# Patient Record
Sex: Male | Born: 1965 | Race: White | Hispanic: No | Marital: Single | Smoking: Current every day smoker
Health system: Southern US, Community
[De-identification: ages and names within clinical notes are randomized; demographics above are authoritative.]

## PROBLEM LIST (undated history)

## (undated) DIAGNOSIS — I2510 Atherosclerotic heart disease of native coronary artery without angina pectoris: Secondary | ICD-10-CM

## (undated) DIAGNOSIS — K219 Gastro-esophageal reflux disease without esophagitis: Secondary | ICD-10-CM

## (undated) DIAGNOSIS — E119 Type 2 diabetes mellitus without complications: Secondary | ICD-10-CM

## (undated) DIAGNOSIS — K439 Ventral hernia without obstruction or gangrene: Secondary | ICD-10-CM

## (undated) DIAGNOSIS — K253 Acute gastric ulcer without hemorrhage or perforation: Secondary | ICD-10-CM

## (undated) DIAGNOSIS — K829 Disease of gallbladder, unspecified: Secondary | ICD-10-CM

## (undated) HISTORY — PX: STENT PLACEMENT VASCULAR (ARMC HX): HXRAD1737

---

## 2015-03-30 ENCOUNTER — Emergency Department: Payer: Medicaid - Out of State

## 2015-03-30 ENCOUNTER — Observation Stay
Admission: EM | Admit: 2015-03-30 | Discharge: 2015-03-31 | Disposition: A | Discharge status: Home / Self Care | Payer: Medicaid - Out of State | Attending: Internal Medicine | Admitting: Internal Medicine

## 2015-03-30 ENCOUNTER — Encounter: Payer: Self-pay | Admitting: Emergency Medicine

## 2015-03-30 DIAGNOSIS — Z794 Long term (current) use of insulin: Secondary | ICD-10-CM | POA: Insufficient documentation

## 2015-03-30 DIAGNOSIS — R61 Generalized hyperhidrosis: Secondary | ICD-10-CM | POA: Insufficient documentation

## 2015-03-30 DIAGNOSIS — Z955 Presence of coronary angioplasty implant and graft: Secondary | ICD-10-CM | POA: Insufficient documentation

## 2015-03-30 DIAGNOSIS — Z7982 Long term (current) use of aspirin: Secondary | ICD-10-CM | POA: Insufficient documentation

## 2015-03-30 DIAGNOSIS — Z79899 Other long term (current) drug therapy: Secondary | ICD-10-CM | POA: Insufficient documentation

## 2015-03-30 DIAGNOSIS — E782 Mixed hyperlipidemia: Secondary | ICD-10-CM | POA: Insufficient documentation

## 2015-03-30 DIAGNOSIS — Z7902 Long term (current) use of antithrombotics/antiplatelets: Secondary | ICD-10-CM | POA: Diagnosis not present

## 2015-03-30 DIAGNOSIS — R42 Dizziness and giddiness: Secondary | ICD-10-CM | POA: Diagnosis present

## 2015-03-30 DIAGNOSIS — F419 Anxiety disorder, unspecified: Secondary | ICD-10-CM

## 2015-03-30 DIAGNOSIS — I10 Essential (primary) hypertension: Secondary | ICD-10-CM | POA: Diagnosis not present

## 2015-03-30 DIAGNOSIS — E119 Type 2 diabetes mellitus without complications: Secondary | ICD-10-CM | POA: Diagnosis not present

## 2015-03-30 DIAGNOSIS — I2089 Other forms of angina pectoris: Secondary | ICD-10-CM

## 2015-03-30 DIAGNOSIS — K219 Gastro-esophageal reflux disease without esophagitis: Secondary | ICD-10-CM | POA: Insufficient documentation

## 2015-03-30 DIAGNOSIS — Z8711 Personal history of peptic ulcer disease: Secondary | ICD-10-CM | POA: Diagnosis not present

## 2015-03-30 DIAGNOSIS — R110 Nausea: Secondary | ICD-10-CM | POA: Insufficient documentation

## 2015-03-30 DIAGNOSIS — F17210 Nicotine dependence, cigarettes, uncomplicated: Secondary | ICD-10-CM | POA: Diagnosis not present

## 2015-03-30 DIAGNOSIS — R079 Chest pain, unspecified: Principal | ICD-10-CM | POA: Insufficient documentation

## 2015-03-30 DIAGNOSIS — Z8249 Family history of ischemic heart disease and other diseases of the circulatory system: Secondary | ICD-10-CM | POA: Diagnosis not present

## 2015-03-30 DIAGNOSIS — I2510 Atherosclerotic heart disease of native coronary artery without angina pectoris: Secondary | ICD-10-CM | POA: Diagnosis present

## 2015-03-30 DIAGNOSIS — R531 Weakness: Secondary | ICD-10-CM | POA: Insufficient documentation

## 2015-03-30 DIAGNOSIS — I208 Other forms of angina pectoris: Secondary | ICD-10-CM

## 2015-03-30 HISTORY — DX: Atherosclerotic heart disease of native coronary artery without angina pectoris: I25.10

## 2015-03-30 HISTORY — DX: Acute gastric ulcer without hemorrhage or perforation: K25.3

## 2015-03-30 HISTORY — DX: Ventral hernia without obstruction or gangrene: K43.9

## 2015-03-30 HISTORY — DX: Gastro-esophageal reflux disease without esophagitis: K21.9

## 2015-03-30 HISTORY — DX: Type 2 diabetes mellitus without complications: E11.9

## 2015-03-30 HISTORY — DX: Disease of gallbladder, unspecified: K82.9

## 2015-03-30 LAB — URINALYSIS COMPLETE WITH MICROSCOPIC (ARMC ONLY)
BACTERIA UA: NONE SEEN
Bilirubin Urine: NEGATIVE
Glucose, UA: NEGATIVE mg/dL
HGB URINE DIPSTICK: NEGATIVE
Ketones, ur: NEGATIVE mg/dL
LEUKOCYTES UA: NEGATIVE
Nitrite: NEGATIVE
PROTEIN: 30 mg/dL — AB
SPECIFIC GRAVITY, URINE: 1.027 (ref 1.005–1.030)
SQUAMOUS EPITHELIAL / LPF: NONE SEEN
pH: 6 (ref 5.0–8.0)

## 2015-03-30 LAB — TROPONIN I

## 2015-03-30 LAB — BASIC METABOLIC PANEL
Anion gap: 7 (ref 5–15)
BUN: 21 mg/dL — ABNORMAL HIGH (ref 6–20)
CHLORIDE: 104 mmol/L (ref 101–111)
CO2: 29 mmol/L (ref 22–32)
Calcium: 9.3 mg/dL (ref 8.9–10.3)
Creatinine, Ser: 0.87 mg/dL (ref 0.61–1.24)
GFR calc non Af Amer: >60 mL/min (ref >60)
Glucose, Bld: 154 mg/dL — ABNORMAL HIGH (ref 65–99)
POTASSIUM: 3.6 mmol/L (ref 3.5–5.1)
SODIUM: 140 mmol/L (ref 135–145)

## 2015-03-30 LAB — CBC
HEMATOCRIT: 39.7 % — AB (ref 40.0–52.0)
Hemoglobin: 14 g/dL (ref 13.0–18.0)
MCH: 30.8 pg (ref 26.0–34.0)
MCHC: 35.2 g/dL (ref 32.0–36.0)
MCV: 87.4 fL (ref 80.0–100.0)
Platelets: 279 10*3/uL (ref 150–440)
RBC: 4.54 MIL/uL (ref 4.40–5.90)
RDW: 12.9 % (ref 11.5–14.5)
WBC: 9.7 10*3/uL (ref 3.8–10.6)

## 2015-03-30 LAB — GLUCOSE, CAPILLARY: GLUCOSE-CAPILLARY: 138 mg/dL — AB (ref 65–99)

## 2015-03-30 MED ORDER — ALPRAZOLAM 0.25 MG PO TABS: 0.2500 mg | ORAL_TABLET | Freq: Three times a day (TID) | ORAL | Status: DC | PRN

## 2015-03-30 MED ORDER — CLOPIDOGREL BISULFATE 75 MG PO TABS
75.0000 mg | ORAL_TABLET | Freq: Once | ORAL | Status: AC
  Administered 2015-03-30: 75 mg via ORAL
  Filled 2015-03-30: qty 1

## 2015-03-30 MED ORDER — ASPIRIN 81 MG PO CHEW
324.0000 mg | CHEWABLE_TABLET | Freq: Once | ORAL | Status: AC
  Administered 2015-03-30: 324 mg via ORAL
  Filled 2015-03-30: qty 4

## 2015-03-30 MED ORDER — SODIUM CHLORIDE 0.9 % IJ SOLN
3.0000 mL | Freq: Two times a day (BID) | INTRAMUSCULAR | Status: DC
  Administered 2015-03-30: 3 mL via INTRAVENOUS
  Administered 2015-03-31: 09:00:00 via INTRAVENOUS

## 2015-03-30 MED ORDER — CLOPIDOGREL BISULFATE 75 MG PO TABS
75.0000 mg | ORAL_TABLET | Freq: Every day | ORAL | Status: DC
Start: 2015-03-30 — End: 2015-03-31
  Administered 2015-03-31: 75 mg via ORAL
  Filled 2015-03-30: qty 1

## 2015-03-30 MED ORDER — HEPARIN SODIUM (PORCINE) 5000 UNIT/ML IJ SOLN: 5000.0000 [IU] | Freq: Three times a day (TID) | INTRAMUSCULAR | Status: DC

## 2015-03-30 MED ORDER — NITROGLYCERIN 0.4 MG SL SUBL: 0.4000 mg | SUBLINGUAL_TABLET | SUBLINGUAL | Status: DC | PRN

## 2015-03-30 MED ORDER — ASPIRIN EC 325 MG PO TBEC
325.0000 mg | DELAYED_RELEASE_TABLET | Freq: Every day | ORAL | Status: DC
Start: 2015-03-30 — End: 2015-03-31
  Administered 2015-03-31: 325 mg via ORAL
  Filled 2015-03-30: qty 1

## 2015-03-30 MED ORDER — INSULIN ASPART 100 UNIT/ML ~~LOC~~ SOLN
0.0000 [IU] | Freq: Three times a day (TID) | SUBCUTANEOUS | Status: DC
  Administered 2015-03-31: 1 [IU] via SUBCUTANEOUS
  Filled 2015-03-30: qty 1

## 2015-03-30 NOTE — H&P (Addendum)
[facility] - [place] at [facility]   PATIENT NAME: Travis Bradshaw    MR#:  [id number]  DATE OF BIRTH:  23-Aug-1965  DATE OF ADMISSION:  03/30/2015  PRIMARY CARE PHYSICIAN: No primary care provider on file.   REQUESTING/REFERRING PHYSICIAN: Quale  CHIEF COMPLAINT:   Chief Complaint  Patient presents with  . Weakness  . Dizziness    HISTORY OF PRESENT ILLNESS: Travis Bradshaw  is a 49 y.o. male with a known history of coronary artery disease and total 8 stent since 2002 to the last one in April 2016, takes his cardiac medications regularly and did not miss any doses. He is visiting from [place], and for last 2 days he has some stress related to his work- up and he started feeling anxious , dizzy, sweating, tingling in numbness in all body, nauseated- these episodes are coming frequently so finally came to emergency room for that. He clearly denies any chest pains or palpitations. But he feels that his blood pressure would be high. On further history he said that a month ago he had similar episodes running for 3-4 days, he went to his primary care doctor. And he was admitted to hospital for 2-3 days where all the workup were done, his cardiologist also saw him in the hospital and they did a nuclear stress test- which was reported to be negative. So finally he discharged him saying that his heart is okay and after liver discharge he was completely fine no complaints until 2 days ago when they started again.  Patient also says that in the past always his stress test and blood work for the hardware has been negative and one time it even happened after having negative stress test within 3 days he had massive MI and had to go for stent.  PAST MEDICAL HISTORY:   Past Medical History  Diagnosis Date  . CAD (coronary artery disease)   . GERD (gastroesophageal reflux disease)   . DM (diabetes mellitus) (HCC)   . Gallbladder attack   . Gastric ulcer, acute   .  Hernia of abdominal wall     PAST SURGICAL HISTORY:  Past Surgical History  Procedure Laterality Date  . Stent placement vascular ([facility] hx)      SOCIAL HISTORY:  Social History  Substance Use Topics  . Smoking status: Current Every Day Smoker -- 0.25 packs/day    Types: Cigarettes  . Smokeless tobacco: Current User    Types: Chew  . Alcohol Use: No    FAMILY HISTORY:  Family History  Problem Relation Age of Onset  . CAD Brother     DRUG ALLERGIES: No Known Allergies  REVIEW OF SYSTEMS:   CONSTITUTIONAL: No fever, fatigue or weakness. Have dizziness EYES: No blurred or double vision.  EARS, NOSE, AND THROAT: No tinnitus or ear pain.  RESPIRATORY: No cough, shortness of breath, wheezing or hemoptysis.  CARDIOVASCULAR: No chest pain, orthopnea, edema.  GASTROINTESTINAL: No nausea, vomiting, diarrhea or abdominal pain.  GENITOURINARY: No dysuria, hematuria.  ENDOCRINE: No polyuria, nocturia,  HEMATOLOGY: No anemia, easy bruising or bleeding SKIN: No rash or lesion. MUSCULOSKELETAL: No joint pain or arthritis.   NEUROLOGIC: Positive for generalized tingling, numbness, no weakness.  PSYCHIATRY: No anxiety or depression.   MEDICATIONS AT HOME:   aspirin EC 81 MG tablet Take 81 mg by mouth at bedtime. Historical Provider, MD Needs Review carvedilol (COREG) 12.5 MG tablet Take 12.5 mg by mouth at bedtime. Historical Provider, MD Needs Review  clopidogrel (PLAVIX) 75 MG tablet Take 75 mg by mouth daily. Historical Provider, MD Needs Review gemfibrozil (LOPID) 600 MG tablet Take 600 mg by mouth at bedtime. Historical Provider, MD Needs Review   PHYSICAL EXAMINATION:   VITAL SIGNS: Blood pressure 142/87, pulse 92, temperature 98.4 F (36.9 C), temperature source Oral, resp. rate 18, height 5' 3.5" (1.613 m), weight 74.39 kg (164 lb), SpO2 98 %.  GENERAL:  49 y.o.-year-old patient lying in the bed with no acute distress.  EYES: Pupils equal, round, reactive to light and  accommodation. No scleral icterus. Extraocular muscles intact.  HEENT: Head atraumatic, normocephalic. Oropharynx and nasopharynx clear.  NECK:  Supple, no jugular venous distention. No thyroid enlargement, no tenderness.  LUNGS: Normal breath sounds bilaterally, no wheezing, rales,rhonchi or crepitation. No use of accessory muscles of respiration.  CARDIOVASCULAR: S1, S2 normal. No murmurs, rubs, or gallops.  ABDOMEN: Soft, nontender, nondistended. Bowel sounds present. No organomegaly or mass.  EXTREMITIES: No pedal edema, cyanosis, or clubbing.  NEUROLOGIC: Cranial nerves II through XII are intact. Muscle strength 5/5 in all extremities. Sensation intact. Gait not checked.  PSYCHIATRIC: The patient is alert and oriented x 3.  SKIN: No obvious rash, lesion, or ulcer.   LABORATORY PANEL:   CBC  Recent Labs Lab 03/30/15 1808  WBC 9.7  HGB 14.0  HCT 39.7*  PLT 279  MCV 87.4  MCH 30.8  MCHC 35.2  RDW 12.9   ------------------------------------------------------------------------------------------------------------------  Chemistries   Recent Labs Lab 03/30/15 1808  NA 140  K 3.6  CL 104  CO2 29  GLUCOSE 154*  BUN 21*  CREATININE 0.87  CALCIUM 9.3   ------------------------------------------------------------------------------------------------------------------ estimated creatinine clearance is 96.9 mL/min (by C-G formula based on Cr of 0.87). ------------------------------------------------------------------------------------------------------------------ No results for input(s): TSH, T4TOTAL, T3FREE, THYROIDAB in the last 72 hours.  Invalid input(s): FREET3   Coagulation profile No results for input(s): INR, PROTIME in the last 168 hours. ------------------------------------------------------------------------------------------------------------------- No results for input(s): DDIMER in the last 72  hours. -------------------------------------------------------------------------------------------------------------------  Cardiac Enzymes  Recent Labs Lab 03/30/15 1808  TROPONINI <0.03   ------------------------------------------------------------------------------------------------------------------ Invalid input(s): POCBNP  ---------------------------------------------------------------------------------------------------------------  Urinalysis No results found for: COLORURINE, APPEARANCEUR, LABSPEC, PHURINE, GLUCOSEU, HGBUR, BILIRUBINUR, KETONESUR, PROTEINUR, UROBILINOGEN, NITRITE, LEUKOCYTESUR   RADIOLOGY: Dg Chest 2 View  03/30/2015  CLINICAL DATA:  Dizziness, lightheadedness, and weakness for 2 days. Coronary artery disease. EXAM: CHEST  2 VIEW COMPARISON:  None. FINDINGS: The heart size and mediastinal contours are within normal limits. Both lungs are clear. No evidence of pneumothorax or pleural effusion. Coronary artery stent noted. The visualized skeletal structures are unremarkable. IMPRESSION: No active cardiopulmonary disease. Electronically Signed   By: Myles RosenthalJohn  Stahl M.D.   On: 03/30/2015 19:41    EKG: Slight T wave inversion- in inferolateral leads.  IMPRESSION AND PLAN:  * Vague complaints including dizziness, numbness, sweating, nausea, nausea.   This may be either anxiety episode or equivalent to angina and unusual presentation.  As he says in the past also he had similar kind of presentation but with chest pain for his MI.   Monitor on telemetry, follow serial troponin, get cardiology evaluation tomorrow morning for further planning.  Meanwhile continue his cardiac medications.  * Anxiety episode  We will give Xanax 0.25 mg every 8 hours as needed.  * Diabetes  We'll keep him on insulin sliding scale coverage.  * Smoking  Counseled to quit smoking for 4 minutes and offered nicotine patch.  All the records are reviewed and case discussed  with ED  provider. Management plans discussed with the patient, family and they are in agreement.  CODE STATUS: Full code   TOTAL TIME TAKING CARE OF THIS PATIENT: 50 minutes.    Altamese Dilling M.D on 03/30/2015   Between 7am to 6pm - Pager - [phone]  After 6pm go to [email or web address] - password [email or web address] [facility]  [facility]  Office  [phone]  CC: Primary care physician; No primary care provider on file.   Note: This dictation was prepared with Dragon dictation along with smaller phrase technology. Any transcriptional errors that result from this process are unintentional.

## 2015-03-30 NOTE — ED Notes (Signed)
Pt C/O light-headedness, weakness, dizziness, and hot flashes. Pt states cardiac hx including 8 stents at this time. Pt denies chest pain at this time. Pt also has hx of hypokalemia and was told that his symptoms could send him into cardiac arrest.

## 2015-03-30 NOTE — ED Provider Notes (Signed)
[facility] Emergency Department Provider Note REMINDER - THIS NOTE IS NOT A FINAL MEDICAL RECORD UNTIL IT IS SIGNED. UNTIL THEN, THE CONTENT BELOW MAY REFLECT INFORMATION FROM A DOCUMENTATION TEMPLATE, NOT THE ACTUAL PATIENT VISIT. ____________________________________________  Time seen: Approximately 7:11 PM  I have reviewed the triage vital signs and the nursing notes.   HISTORY  Chief Complaint Weakness and Dizziness    HPI Travis Bradshaw is a 49 y.o. male presents with intermittent nausea, lightheadedness, sweatiness, which is been fairly steady but will come and go worse off and on for the last 2 days. Reports this feels just like previous heart attack except he's not having any associated chest pain. He is a diabetic. He has not taken his aspirin or Plavix at today.  Continues to smoke. Last catheterization was in April where he said he had a occluded vessel. He is from [place] and traveling.  Past Medical History  Diagnosis Date  . CAD (coronary artery disease)   . GERD (gastroesophageal reflux disease)   . DM (diabetes mellitus) (HCC)   . Gallbladder attack   . Gastric ulcer, acute   . Hernia of abdominal wall     Patient Active Problem List   Diagnosis Date Noted  . Angina at rest [facility](HCC) 03/30/2015  . Anxiety 03/30/2015    Past Surgical History  Procedure Laterality Date  . Stent placement vascular ([facility] hx)      No current outpatient prescriptions on file.  Allergies Review of patient's allergies indicates no known allergies.  Family History  Problem Relation Age of Onset  . CAD Brother     Social History Social History  Substance Use Topics  . Smoking status: Current Every Day Smoker -- 0.25 packs/day    Types: Cigarettes  . Smokeless tobacco: Current User    Types: Chew  . Alcohol Use: No    Review of Systems Constitutional: No fever/chills Eyes: No visual changes. ENT: No sore throat. Cardiovascular: Denies chest  pain. Respiratory: Denies shortness of breath. Gastrointestinal: No abdominal pain.  Nausea and feels as though he could vomit but has not. Took a nitroglycerin with no relief.  No diarrhea.  No constipation. Genitourinary: Negative for dysuria. Musculoskeletal: Negative for back pain. Skin: Negative for rash. Neurological: Negative for headaches, focal weakness or numbness.  Feels generally fatigued and weak off-and-on. No focal weakness, no slurred speech. No numbness or tingling in one or extremity, but at times he feels like a whole body tingling feeling.  10-point ROS otherwise negative.  ____________________________________________   PHYSICAL EXAM:  VITAL SIGNS: ED Triage Vitals  Enc Vitals Group     BP 03/30/15 1804 142/87 mmHg     Pulse Rate 03/30/15 1804 92     Resp 03/30/15 1804 18     Temp 03/30/15 1804 98.4 F (36.9 C)     Temp Source 03/30/15 1804 Oral     SpO2 03/30/15 1804 98 %     Weight 03/30/15 1804 164 lb (74.39 kg)     Height 03/30/15 1804 5' 3.5" (1.613 m)     Head Cir --      Peak Flow --      Pain Score --      Pain Loc --      Pain Edu? --      Excl. in GC? --    Constitutional: Alert and oriented. Well appearing and in no acute distress. Eyes: Conjunctivae are normal. PERRL. EOMI. Head: Atraumatic. Nose: No congestion/rhinnorhea. Mouth/Throat:  Mucous membranes are moist.  Oropharynx non-erythematous. Neck: No stridor.   Cardiovascular: Normal rate, regular rhythm. Grossly normal heart sounds.  Good peripheral circulation. Respiratory: Normal respiratory effort.  No retractions. Lungs CTAB. Gastrointestinal: Soft and nontender. No distention. No abdominal bruits. No CVA tenderness. Musculoskeletal: No lower extremity tenderness nor edema.  No joint effusions. Neurologic:  Normal speech and language. No gross focal neurologic deficits are appreciated. No gait instability. Skin:  Skin is warm, dry and intact. No rash noted. Psychiatric: Mood and  affect are normal. Speech and behavior are normal.  ____________________________________________   LABS (all labs ordered are listed, but only abnormal results are displayed)  Labs Reviewed  BASIC METABOLIC PANEL - Abnormal; Notable for the following:    Glucose, Bld 154 (*)    BUN 21 (*)    All other components within normal limits  CBC - Abnormal; Notable for the following:    HCT 39.7 (*)    All other components within normal limits  TROPONIN I  URINALYSIS COMPLETEWITH MICROSCOPIC ([facility] ONLY)  LIPID PANEL  TROPONIN I  TROPONIN I  TROPONIN I  CBG MONITORING, ED   ____________________________________________  EKG  Reviewed and interpreted by me EKG done at 1802 Heart rate 85 PR 160 QRS 80 QTc 406 Normal sinus rhythm, evidence of old anterior MI, there is some very slight less than 1 mm of ST elevation versus artifact into 3 and aVF, also minimal in the left lateral leads. Does not meet STEMI criteria, could be early repolarization. 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 Does not meet STEMI criteria, could be early repolarization. Compared with previous EKG from 1 hour earlier no significant changes found ____________________________________________  RADIOLOGY  FINDINGS: The heart size and mediastinal contours are within normal limits. Both lungs are clear. No evidence of pneumothorax or pleural effusion. Coronary artery stent noted. The visualized skeletal structures are unremarkable.  IMPRESSION: No active cardiopulmonary disease. ____________________________________________   PROCEDURES  Procedure(s) performed: None  Critical Care performed: No  ____________________________________________   INITIAL  IMPRESSION / ASSESSMENT AND PLAN / ED COURSE  Pertinent labs & imaging results that were available during my care of the patient were reviewed by me and considered in my medical decision making (see chart for details).  Patient is family reports to me that he is had 8 cardiac stents, presenting with lightheadedness, weakness dizziness and diaphoresis but generally comfortable chest pain. He reports to me that he has all of his symptoms of cardiac disease except for the chest pain component. He's had previous he states EKGs and been normal along with normal troponins, but has had occluded stents or heart attacks despite normal EKGs and negative troponins.  Given the patient's symptomatology, though he does not have chest pain he does have concerning symptoms could nausea, diaphoresis, fatigue, and reports these may be his anginal: Suspicion in a patient who is a known diabetic who may have a silent MI. Is a very strong history of cardiac disease. He has 2 siblings in her 51s also died of MI. The present time he has some questionable minimal T-wave abnormality which may be normal for him seen primarily in the inferior distribution, and stable across 2 EKGs without  evidence of meeting STEMI criteria. Discussed with the patient and based on his strong history, and concerning presentation and symptomatology will admit him for chest pain observation exclusion of coronary disease.      Pulmonary Embolism Rule-out Criteria (PERC rule)                        If YES to ANY of the following, the PERC rule is not satisfied and cannot be used to rule out PE in this patient (consider d-dimer or imaging depending on pre-test probability).                      If NO to ALL of the following, AND the clinician's pre-test probability is <15%, the [facility] rule is satisfied and there is no need for further workup (including no need to obtain a d-dimer) as the post-test probability of pulmonary embolism is <2%.                       Mnemonic is HAD CLOTS   H - hormone use (exogenous estrogen)      No. A - age > 50                                                 No. D - DVT/PE history                                      No.   C - coughing blood (hemoptysis)                 No. L - leg swelling, unilateral                             No. O - O2 Sat on Room Air < 95%                  No. T - tachycardia (HR ? 100)                         No. S - surgery or trauma, recent                      No.   Based on my evaluation of the patient, including application of this decision instrument, further testing to evaluate for pulmonary embolism is not indicated at this time. I have discussed this recommendation with the patient who states understanding and agreement with this plan.  ____________________________________________   FINAL CLINICAL IMPRESSION(S) / ED DIAGNOSES  Final diagnoses:  Nausea  Cardiovascular disease  Lightheaded      Sharyn Creamer, MD 03/30/15 2010

## 2015-03-31 LAB — CBC
HCT: 39 % — ABNORMAL LOW (ref 40.0–52.0)
HEMOGLOBIN: 13.4 g/dL (ref 13.0–18.0)
MCH: 30.3 pg (ref 26.0–34.0)
MCHC: 34.4 g/dL (ref 32.0–36.0)
MCV: 87.9 fL (ref 80.0–100.0)
PLATELETS: 240 10*3/uL (ref 150–440)
RBC: 4.43 MIL/uL (ref 4.40–5.90)
RDW: 12.7 % (ref 11.5–14.5)
WBC: 8.1 10*3/uL (ref 3.8–10.6)

## 2015-03-31 LAB — BASIC METABOLIC PANEL
ANION GAP: 8 (ref 5–15)
BUN: 19 mg/dL (ref 6–20)
CALCIUM: 8.9 mg/dL (ref 8.9–10.3)
CO2: 26 mmol/L (ref 22–32)
CREATININE: 0.68 mg/dL (ref 0.61–1.24)
Chloride: 105 mmol/L (ref 101–111)
GLUCOSE: 138 mg/dL — AB (ref 65–99)
Potassium: 3.9 mmol/L (ref 3.5–5.1)
Sodium: 139 mmol/L (ref 135–145)

## 2015-03-31 LAB — GLUCOSE, CAPILLARY
GLUCOSE-CAPILLARY: 144 mg/dL — AB (ref 65–99)
Glucose-Capillary: 120 mg/dL — ABNORMAL HIGH (ref 65–99)

## 2015-03-31 LAB — LIPID PANEL
CHOLESTEROL: 234 mg/dL — AB (ref 0–200)
HDL: 29 mg/dL — ABNORMAL LOW (ref >40)
LDL Cholesterol: 173 mg/dL — ABNORMAL HIGH (ref 0–99)
Total CHOL/HDL Ratio: 8.1 RATIO
Triglycerides: 162 mg/dL — ABNORMAL HIGH (ref <150)
VLDL: 32 mg/dL (ref 0–40)

## 2015-03-31 LAB — TROPONIN I

## 2015-03-31 MED ORDER — ISOSORBIDE MONONITRATE ER 30 MG PO TB24: 30.0000 mg | ORAL_TABLET | Freq: Every day | ORAL | Status: AC

## 2015-03-31 MED ORDER — ISOSORBIDE MONONITRATE ER 30 MG PO TB24
30.0000 mg | ORAL_TABLET | Freq: Every day | ORAL | Status: DC
  Administered 2015-03-31: 30 mg via ORAL
  Filled 2015-03-31: qty 1

## 2015-03-31 NOTE — Discharge Summary (Signed)
Travis Bradshaw, is a 49 y.o. male  DOB May 12, 1965  MRN [id number].  Admission date:  03/30/2015  Admitting Physician  Vaughan Basta, MD  Discharge Date:  03/31/2015   Primary MD  No primary care provider on file.  Recommendations for primary care physician for things to follow:   Follow-up with primary cardiologist in [place] in 1 week.   Admission Diagnosis  Cardiovascular disease [I25.10] Nausea [R11.0] Lightheaded [R42] Anginal equivalent ([place]) [I20.8]   Discharge Diagnosis  Cardiovascular disease [I25.10] Nausea [R11.0] Lightheaded [R42] Anginal equivalent ([place]) [I20.8]   Principal Problem:   Angina at rest [facility]) Active Problems:   Anxiety      Past Medical History  Diagnosis Date  . CAD (coronary artery disease)   . GERD (gastroesophageal reflux disease)   . DM (diabetes mellitus) ([place])   . Gallbladder attack   . Gastric ulcer, acute   . Hernia of abdominal wall     Past Surgical History  Procedure Laterality Date  . Stent placement vascular ([facility] hx)         History of present illness and  Hospital Course:     Kindly see H&P for history of present illness and admission details, please review complete Labs, Consult reports and Test reports for all details in brief  HPI  from the history and physical done on the day of admission  49 year old male patient with history of for coronary artery disease and multiple stents came in because of fatigue, nausea or diaphoresis and chest pain. Patient is visiting the his family from [place] for Thanksgiving. Admitted to hospitalist service on telemetry for chest pain. Patient EKG did not show any acute ST-T changes.  Hospital Course  1. chest pain likely due to anxiety: Troponins are negative for 2 times. Patient CBC and met B are normal.  Started on Xanax for anxiety. Today he feels less nauseous no chest pain and eager to go home. Patient primary cardiologist is in [place] and advised him to follow up with him.  #2 coronary artery disease with multiple stents placed:; Not MI at this time. Continue aspirin, Plavix, beta blockers. Started on low dose of Imdur.. Patient LDL is 173. 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 And he says he cannot take them anymore. He takes gemfibrozil. Advised him to continue that and also continue to take omega-3 fatty acids up to 4 g per day. He is already bought them and advised him to continue that.   #3 anxiety issues patient is asking about medication for anxiety advised him to follow-up with his primary doctor for follow-up to see if he can be started on Paxil.  Discharge Condition stable   Follow UP      Discharge Instructions  and  Discharge Medications        Medication List    TAKE these medications        aspirin EC 81 MG tablet  Take 81 mg by mouth at bedtime.     carvedilol 12.5 MG tablet  Commonly known as:  COREG  Take 12.5 mg by mouth at bedtime.     clopidogrel 75 MG tablet  Commonly known as:  PLAVIX  Take 75 mg by mouth daily.     gemfibrozil 600 MG tablet  Commonly known as:  LOPID  Take 600 mg by mouth at bedtime.     isosorbide mononitrate 30 MG 24 hr tablet  Commonly known as:  IMDUR  Take 1 tablet (  30 mg total) by mouth daily.          Diet and Activity recommendation: See Discharge Instructions above   Consults obtained - cardiology   Major procedures and Radiology Reports - PLEASE review detailed and final reports for all details, in brief -      Dg Chest 2 View  03/30/2015  CLINICAL DATA:  Dizziness, lightheadedness, and weakness for 2 days. Coronary artery disease. EXAM: CHEST  2 VIEW COMPARISON:  None. FINDINGS: The heart size and mediastinal contours are within normal limits. Both lungs are clear.  No evidence of pneumothorax or pleural effusion. Coronary artery stent noted. The visualized skeletal structures are unremarkable. IMPRESSION: No active cardiopulmonary disease. Electronically Signed   By: Earle Gell M.D.   On: 03/30/2015 19:41    Micro Results     No results found for this or any previous visit (from the past 240 hour(s)).     Today   Subjective:   Travis Bradshaw today has no headache,no chest abdominal pain,no new weakness tingling or numbness, feels much better wants to go home today.  Objective:   Blood pressure 115/76, pulse 74, temperature 97.8 F (36.6 C), temperature source Oral, resp. rate 18, height 5' 3.5" (1.613 m), weight 74.39 kg (164 lb), SpO2 98 %.   Intake/Output Summary (Last 24 hours) at 03/31/15 1259 Last data filed at 03/31/15 0900  Gross per 24 hour  Intake    245 ml  Output      0 ml  Net    245 ml    Exam Awake Alert, Oriented x 3, No new F.N deficits, Normal affect [place].AT,PERRAL Supple Neck,No JVD, No cervical lymphadenopathy appriciated.  Symmetrical Chest wall movement, Good air movement bilaterally, CTAB RRR,No Gallops,Rubs or new Murmurs, No Parasternal Heave +ve B.Sounds, Abd Soft, Non tender, No organomegaly appriciated, No rebound -guarding or rigidity. No Cyanosis, Clubbing or edema, No new Rash or bruise  Data Review   CBC w Diff: Lab Results  Component Value Date   WBC 8.1 03/31/2015   HGB 13.4 03/31/2015   HCT 39.0* 03/31/2015   PLT 240 03/31/2015    CMP: Lab Results  Component Value Date   NA 139 03/31/2015   K 3.9 03/31/2015   CL 105 03/31/2015   CO2 26 03/31/2015   BUN 19 03/31/2015   CREATININE 0.68 03/31/2015  .   Total Time in preparing paper work, data evaluation and todays exam - 25 minutes  Mazen Marcin M.D on 03/31/2015 at 12:59 PM    Note: This dictation was prepared with Dragon dictation along with smaller phrase technology. Any transcriptional errors that result from this  process are unintentional.

## 2015-03-31 NOTE — Progress Notes (Signed)
Arrival Method: via stretcher with ED techs Mental Orientation: A&O Telemetry: [id number] Skin: Intact, verified with Teodoro SprayBrooke Robertson, RN IV: 20g left forearm Pain:  No pain Safety Measures: Safety Fall Prevention Plan has been given, discussed & signed, non skid socks in place. howerver patient does not want to wear the socks, educated to call for assistance. 2A Orientation: Patient has been orientated to the room, unit & staff.  Family: Has been informed of plan of care.  Orders have been reviewed & implemented. Will continue to monitor the patient. Call light has been placed within reach.  Eden LatheLexi Miller, RN

## 2015-03-31 NOTE — Discharge Instructions (Signed)
Isosorbide Mononitrate extended-release tablets  What is this medicine?  ISOSORBIDE MONONITRATE (eye soe SOR bide mon oh NYE trate) is a vasodilator. It relaxes blood vessels, increasing the blood and oxygen supply to your heart. This medicine is used to prevent chest pain caused by angina. It will not help to stop an episode of chest pain.  This medicine may be used for other purposes; ask your health care provider or pharmacist if you have questions.  What should I tell my health care provider before I take this medicine?  They need to know if you have any of these conditions:  -previous heart attack or heart failure  -an unusual or allergic reaction to isosorbide mononitrate, nitrates, other medicines, foods, dyes, or preservatives  -pregnant or trying to get pregnant  -breast-feeding  How should I use this medicine?  Take this medicine by mouth with a glass of water. Follow the directions on the prescription label. Do not crush or chew. Take your medicine at regular intervals. Do not take your medicine more often than directed. Do not stop taking this medicine except on the advice of your doctor or health care professional.  Talk to your pediatrician regarding the use of this medicine in children. Special care may be needed.  Overdosage: If you think you have taken too much of this medicine contact a poison control center or emergency room at once.  NOTE: This medicine is only for you. Do not share this medicine with others.  What if I miss a dose?  If you miss a dose, take it as soon as you can. If it is almost time for your next dose, take only that dose. Do not take double or extra doses.  What may interact with this medicine?  Do not take this medicine with any of the following medications:  -medicines used to treat erectile dysfunction (ED) like avanafil, sildenafil, tadalafil, and vardenafil  -riociguat  This medicine may also interact with the following medications:  -medicines for high blood  pressure  -other medicines for angina or heart failure  This list may not describe all possible interactions. Give your health care provider a list of all the medicines, herbs, non-prescription drugs, or dietary supplements you use. Also tell them if you smoke, drink alcohol, or use illegal drugs. Some items may interact with your medicine.  What should I watch for while using this medicine?  Check your heart rate and blood pressure regularly while you are taking this medicine. Ask your doctor or health care professional what your heart rate and blood pressure should be and when you should contact him or her. Tell your doctor or health care professional if you feel your medicine is no longer working.  You may get dizzy. Do not drive, use machinery, or do anything that needs mental alertness until you know how this medicine affects you. To reduce the risk of dizzy or fainting spells, do not sit or stand up quickly, especially if you are an older patient. Alcohol can make you more dizzy, and increase flushing and rapid heartbeats. Avoid alcoholic drinks.  Do not treat yourself for coughs, colds, or pain while you are taking this medicine without asking your doctor or health care professional for advice. Some ingredients may increase your blood pressure.  What side effects may I notice from receiving this medicine?  Side effects that you should report to your doctor or health care professional as soon as possible:  -bluish discoloration of lips, fingernails, or palms of   hands  -irregular heartbeat, palpitations  -low blood pressure  -nausea, vomiting  -persistent headache  -unusually weak or tired  Side effects that usually do not require medical attention (report to your doctor or health care professional if they continue or are bothersome):  -flushing of the face or neck  -rash  This list may not describe all possible side effects. Call your doctor for medical advice about side effects. You may report side effects to  FDA at 1-800-FDA-1088.  Where should I keep my medicine?  Keep out of the reach of children.  Store between 15 and 30 degrees C (59 and 86 degrees F). Keep container tightly closed. Throw away any unused medicine after the expiration date.  NOTE: This sheet is a summary. It may not cover all possible information. If you have questions about this medicine, talk to your doctor, pharmacist, or health care provider.     © 2016, [employer]. (2013-02-19 14:48:19)

## 2015-03-31 NOTE — Progress Notes (Signed)
Patient discharged via wheelchair and private vehicle. IV removed. Discharge instructions given and patient verbalized understanding. Telemetry box removed and returned.

## 2015-03-31 NOTE — Consult Note (Signed)
[facility] Cardiology Consultation Note  Patient ID: Travis Bradshaw, MRN: [id number], DOB/AGE: 12-19-1965 49 y.o. Admit date: 03/30/2015   Date of Consult: 03/31/2015 Primary Physician: No primary care provider on file. Primary Cardiologist: None  Chief Complaint:  Chief Complaint  Patient presents with  . Weakness  . Dizziness   Reason for Consult: known coronary artery disease with unusual chest discomfort  HPI: 49 y.o. male with known coronary artery disease with multiple stents in the past who has recently had a stent in April 2016. At that time the patient had weakness fatigue chest pain nausea and diaphoresis. At that time he underwent PCI and stent placement and had some improvements. The patient has done fairly well until recently when he is had some minor symptoms of dizziness and unsteadiness as well as some mild amount of shortness of breath. He was seen in the hospital with a normal stress test normal echocardiogram and no evidence of myocardial infarction with no changes in EKG. Since he is traveled here to [facility] the patient has had some minor issues of the weakness and slightly dizziness of unknown etiology. There is concerns that this is from a cardiovascular standpoint and he has had observation overnight. With this observation overnight there is been no evidence of myocardial infarction and the patient has had a normal EKG and normal telemetry. There has been no progression of symptoms at this time. The patient previously has been on statin therapy although is not currently on statin therapy. He has been on aspirin and Plavix for recent stent placement. The patient has been discussed the possibility that there is no current myocardial infarction and may be able to be discharged home after ambulation and further evaluated at his primary cardiologist  Past Medical History  Diagnosis Date  . CAD (coronary artery disease)   . GERD (gastroesophageal reflux disease)   . DM  (diabetes mellitus) (HCC)   . Gallbladder attack   . Gastric ulcer, acute   . Hernia of abdominal wall       Surgical History:  Past Surgical History  Procedure Laterality Date  . Stent placement vascular ([facility] hx)       Home Meds: Prior to Admission medications   Medication Sig Start Date End Date Taking? Authorizing Provider  aspirin EC 81 MG tablet Take 81 mg by mouth at bedtime.   Yes Historical Provider, MD  carvedilol (COREG) 12.5 MG tablet Take 12.5 mg by mouth at bedtime.   Yes Historical Provider, MD  clopidogrel (PLAVIX) 75 MG tablet Take 75 mg by mouth daily.   Yes Historical Provider, MD  gemfibrozil (LOPID) 600 MG tablet Take 600 mg by mouth at bedtime.   Yes Historical Provider, MD    Inpatient Medications:  . aspirin EC  325 mg Oral Daily  . clopidogrel  75 mg Oral Daily  . heparin  5,000 Units Subcutaneous 3 times per day  . insulin aspart  0-9 Units Subcutaneous TID WC  . isosorbide mononitrate  30 mg Oral Daily  . sodium chloride  3 mL Intravenous Q12H      Allergies: No Known Allergies  Social History   Social History  . Marital Status: Single    Spouse Name: N/A  . Number of Children: N/A  . Years of Education: N/A   Occupational History  . Not on file.   Social History Main Topics  . Smoking status: Current Every Day Smoker -- 0.25 packs/day    Types: Cigarettes  . Smokeless  tobacco: Current User    Types: Chew  . Alcohol Use: No  . Drug Use: No  . Sexual Activity: Not on file   Other Topics Concern  . Not on file   Social History Narrative  . No narrative on file     Family History  Problem Relation Age of Onset  . CAD Brother      Review of Systems Positive for dizziness weakness Negative for: General:  chills, fever, night sweats or weight changes.  Cardiovascular: PND orthopnea syncope   Dermatological skin lesions rashes Respiratory: Cough congestion Urologic: Frequent urination urination at night and  hematuria Abdominal: negative for nausea, vomiting, diarrhea, bright red blood per rectum, melena, or hematemesis Neurologic: negative for visual changes, and/or hearing changes  All other systems reviewed and are otherwise negative except as noted above.  Labs:  Recent Labs  03/30/15 1808 03/30/15 2328 03/31/15 0449  TROPONINI <0.03 <0.03 <0.03   Lab Results  Component Value Date   WBC 8.1 03/31/2015   HGB 13.4 03/31/2015   HCT 39.0* 03/31/2015   MCV 87.9 03/31/2015   PLT 240 03/31/2015    Recent Labs Lab 03/31/15 0409  NA 139  K 3.9  CL 105  CO2 26  BUN 19  CREATININE 0.68  CALCIUM 8.9  GLUCOSE 138*   Lab Results  Component Value Date   CHOL 234* 03/31/2015   HDL 29* 03/31/2015   LDLCALC 173* 03/31/2015   TRIG 162* 03/31/2015   No results found for: DDIMER  Radiology/Studies:  Dg Chest 2 View  03/30/2015  CLINICAL DATA:  Dizziness, lightheadedness, and weakness for 2 days. Coronary artery disease. EXAM: CHEST  2 VIEW COMPARISON:  None. FINDINGS: The heart size and mediastinal contours are within normal limits. Both lungs are clear. No evidence of pneumothorax or pleural effusion. Coronary artery stent noted. The visualized skeletal structures are unremarkable. IMPRESSION: No active cardiopulmonary disease. Electronically Signed   By: Myles Rosenthal M.D.   On: 03/30/2015 19:41    EKG: Normal sinus rhythm. Normal EKG  Weights: Filed Weights   03/30/15 1804  Weight: 164 lb (74.39 kg)     Physical Exam: Blood pressure 119/82, pulse 76, temperature 97.8 F (36.6 C), temperature source Oral, resp. rate 18, height 5' 3.5" (1.613 m), weight 164 lb (74.39 kg), SpO2 98 %. Body mass index is 28.59 kg/(m^2). General: Well developed, well nourished, in no acute distress. Head eyes ears nose throat: Normocephalic, atraumatic, sclera non-icteric, no xanthomas, nares are without discharge. No apparent thyromegaly and/or mass  Lungs: Normal respiratory effort.  no  wheezes, no rales, no rhonchi.  Heart: RRR with normal S1 S2. no murmur gallop, no rub, PMI is normal size and placement, carotid upstroke normal without bruit, jugular venous pressure is normal Abdomen: Soft, non-tender, non-distended with normoactive bowel sounds. No hepatomegaly. No rebound/guarding. No obvious abdominal masses. Abdominal aorta is normal size without bruit Extremities:   no cyanosis, no clubbing, no ulcers  Peripheral : 2+ bilateral upper extremity pulses, 2+ bilateral femoral pulses, 2+ bilateral dorsal pedal pulse Neuro: Alert and oriented. No facial asymmetry. No focal deficit. Moves all extremities spontaneously. Musculoskeletal: Normal muscle tone without kyphosis Psych:  Responds to questions appropriately with a normal affect.    Assessment: 49 year old male with known coronary artery disease essential hypertension mixed hyperlipidemia diabetes with complication with previous PCI and stent placement with atypical symptoms without evidence of myocardial infarction EKG changes aren't failure  Plan: 1. Begin isosorbide and begin ambulation to  follow for any further significant symptoms and further adjustments of medication 2. Further consideration of discharged home for primary cardiologist to further evaluate symptoms including stress test and/or cardiac catheterization as necessary. 3. Continue and/or initiated high intensity cholesterol therapy with statin therapy if able 4. Ambulation with current medical regimen to reassess significant symptoms of true angina  Signed, Lamar BlinksKOWALSKI,Demani Mcbrien J M.D. [facility] 03/31/2015, 7:50 AM

## 2015-03-31 NOTE — Care Management (Addendum)
Patient visiting family from [place].  RNCM assessment due to patient being listed as out of state Medicaid.  Patient will be returning to [place] post discharge where his Medicaid is active.

## 2017-07-30 IMAGING — CR DG CHEST 2V
1 series · 2 of 2 positions shown · non-contrast
Comparison: None.

CLINICAL DATA: Dizziness, lightheadedness, and weakness for 2 days.
Coronary artery disease.

EXAM:
CHEST  2 VIEW

[Series 1: dg chest 2 view · 0.14mm/px · 2 of 2 slices shown]
[im 1/2]
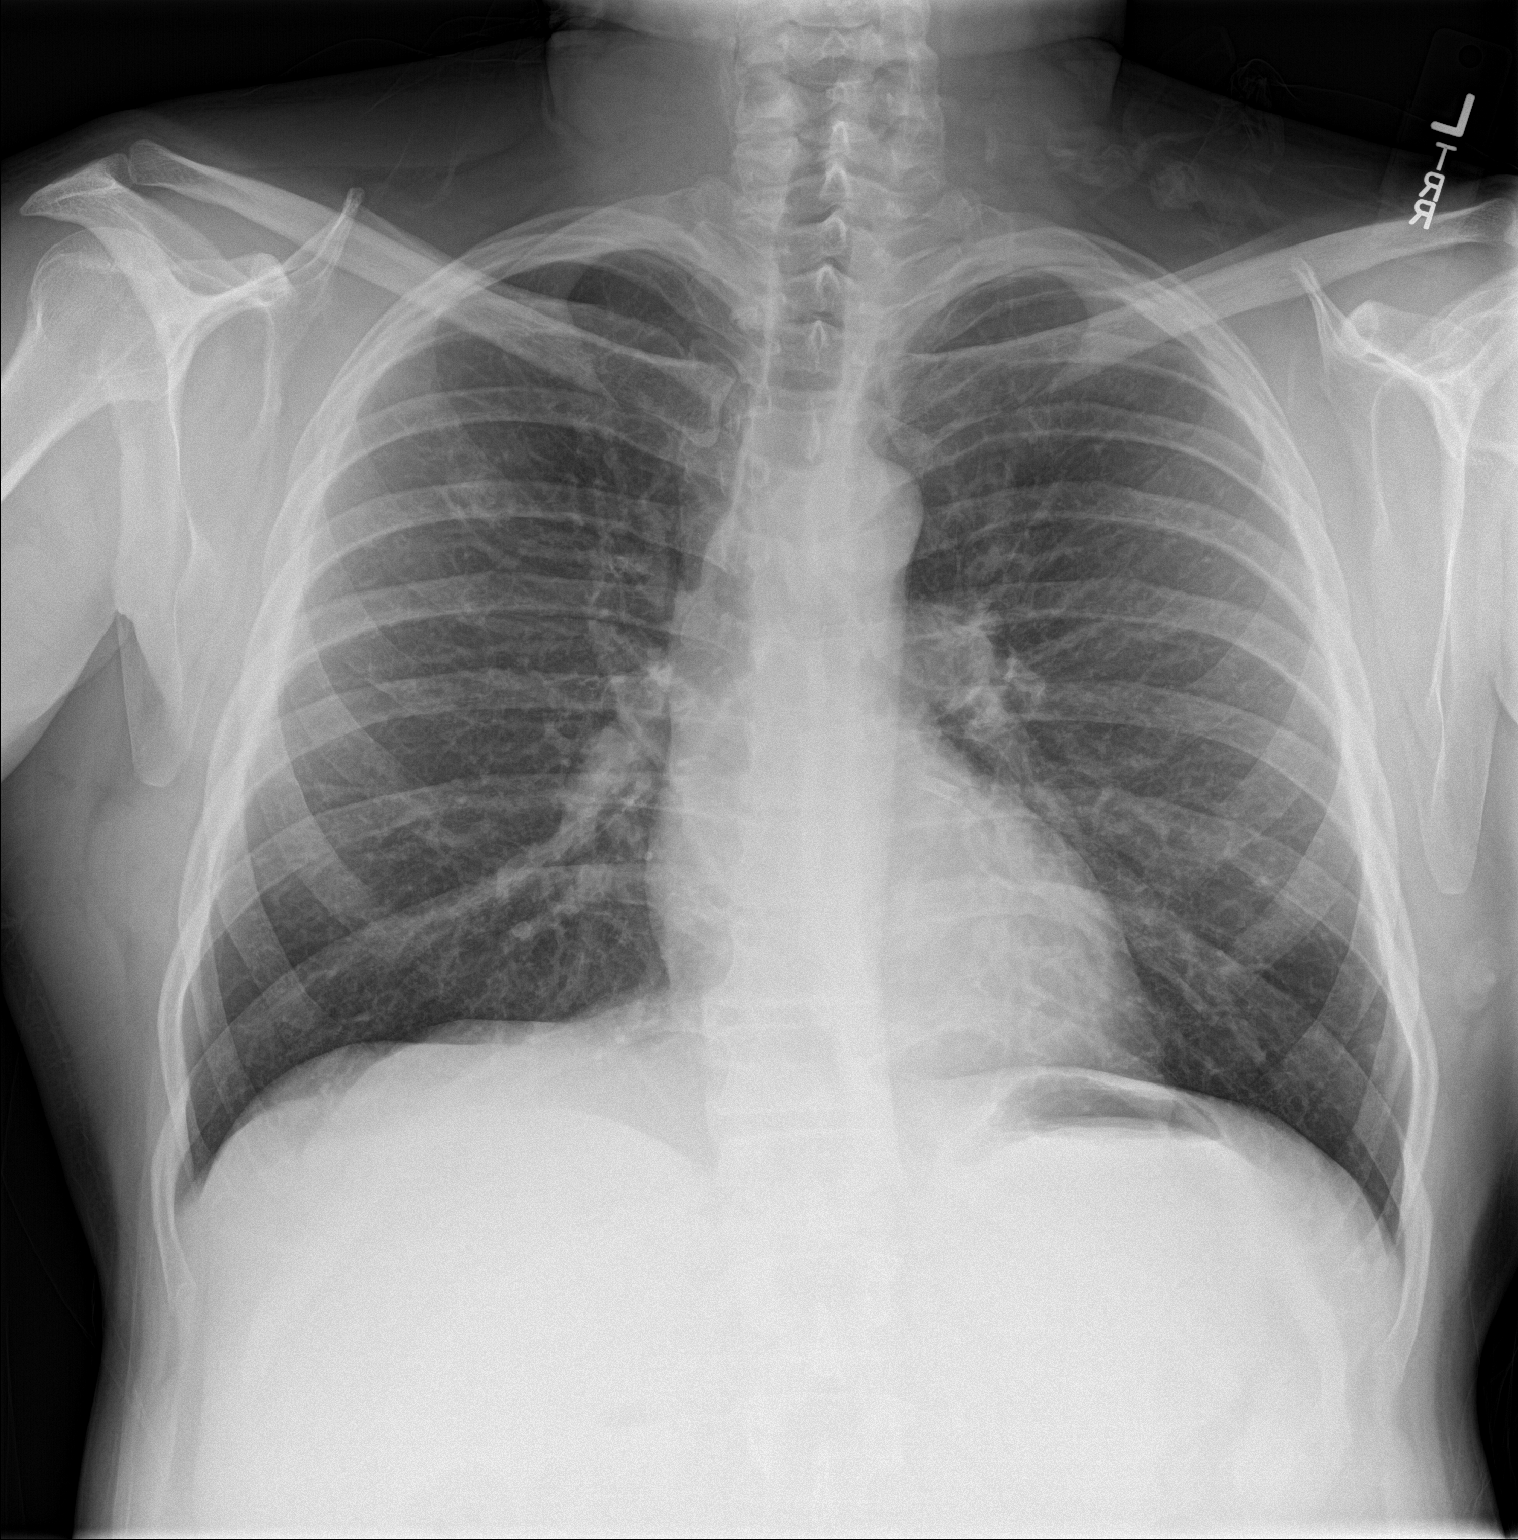
[im 2/2]
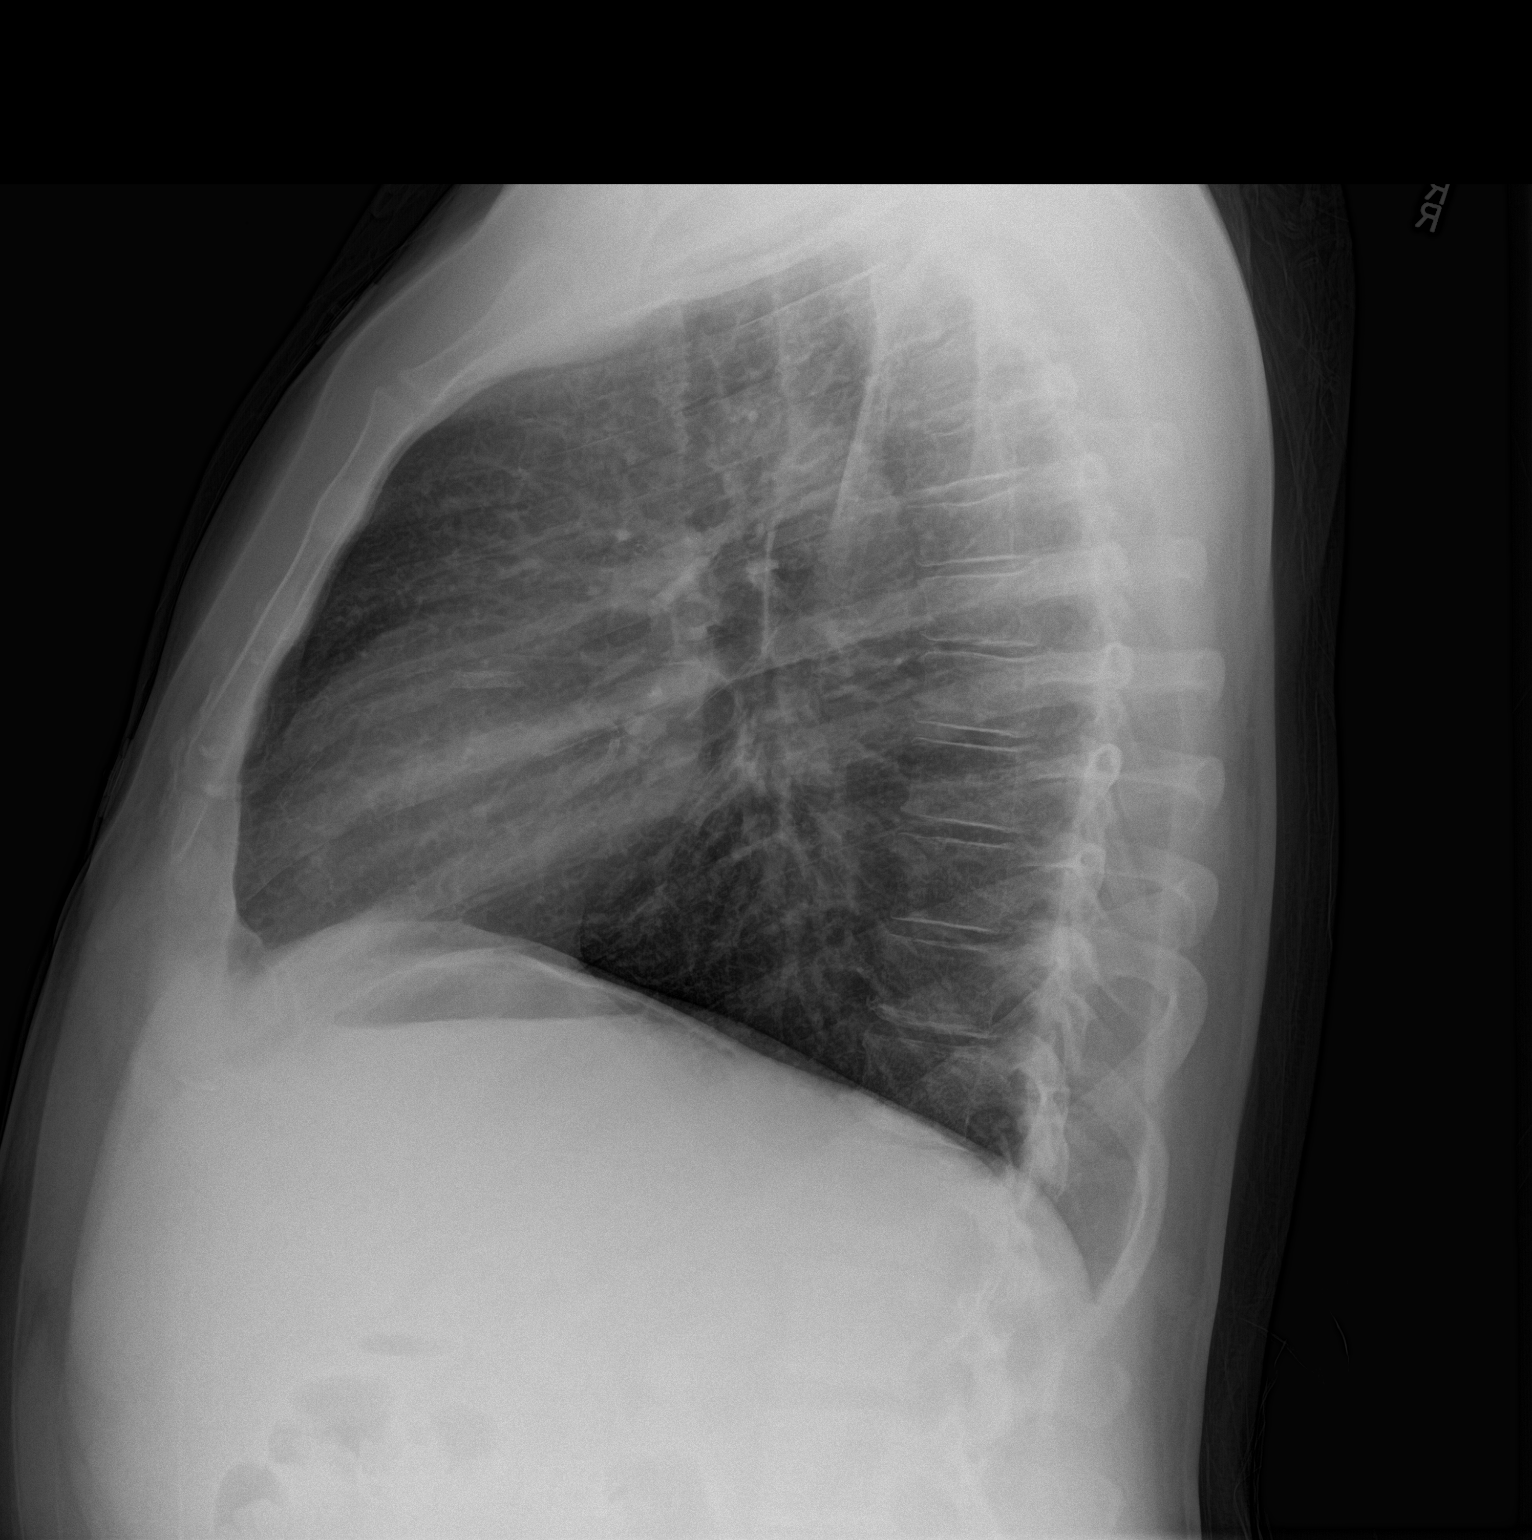

[2 of 2 positions shown; findings below may reference images not displayed]

FINDINGS: The heart size and mediastinal contours are within normal limits.
Both lungs are clear. No evidence of pneumothorax or pleural
effusion. Coronary artery stent noted. The visualized skeletal
structures are unremarkable.
IMPRESSION: No active cardiopulmonary disease.
